# Patient Record
Sex: Male | Born: 1985 | Race: White | Hispanic: No | Marital: Single | Smoking: Current some day smoker
Health system: Southern US, Community
[De-identification: ages and names within clinical notes are randomized; demographics above are authoritative.]

---

## 2011-02-12 ENCOUNTER — Emergency Department (HOSPITAL_BASED_OUTPATIENT_CLINIC_OR_DEPARTMENT_OTHER)
Admission: EM | Admit: 2011-02-12 | Discharge: 2011-02-12 | Disposition: A | Discharge status: Home / Self Care | Payer: Self-pay | Attending: Emergency Medicine | Admitting: Emergency Medicine

## 2011-02-12 ENCOUNTER — Encounter: Payer: Self-pay | Admitting: Emergency Medicine

## 2011-02-12 DIAGNOSIS — S01119A Laceration without foreign body of unspecified eyelid and periocular area, initial encounter: Secondary | ICD-10-CM | POA: Insufficient documentation

## 2011-02-12 DIAGNOSIS — S01111A Laceration without foreign body of right eyelid and periocular area, initial encounter: Secondary | ICD-10-CM

## 2011-02-12 DIAGNOSIS — Y9367 Activity, basketball: Secondary | ICD-10-CM | POA: Insufficient documentation

## 2011-02-12 DIAGNOSIS — W219XXA Striking against or struck by unspecified sports equipment, initial encounter: Secondary | ICD-10-CM | POA: Insufficient documentation

## 2011-02-12 MED ORDER — TETANUS-DIPHTHERIA TOXOIDS TD 5-2 LFU IM INJ
INJECTION | INTRAMUSCULAR | Status: AC
  Filled 2011-02-12: qty 0.5

## 2011-02-12 MED ORDER — TETANUS-DIPHTH-ACELL PERTUSSIS 5-2-15.5 LF-MCG/0.5 IM SUSP
0.5000 mL | Freq: Once | INTRAMUSCULAR | Status: AC
  Administered 2011-02-12: 0.5 mL via INTRAMUSCULAR
  Filled 2011-02-12: qty 0.5

## 2011-02-12 NOTE — ED Provider Notes (Signed)
History     Chief Complaint  Patient presents with  . Facial Laceration   HPI Comments: Pt was hit in the right eyelid while playing basketball.  Patient is a 25 y.o. male presenting with facial injury. The history is provided by the patient and the spouse.  Facial Injury  The injury mechanism was a direct blow. No protective equipment was used. There is an injury to the face. The pain is mild. It is unlikely that a foreign body is present. There is no possibility that he inhaled smoke. Associated symptoms include weakness. Pertinent negatives include no nausea, no vomiting, no neck pain, no decreased responsiveness, no loss of consciousness and no memory loss. There have been no prior injuries to these areas. His tetanus status is out of date. He has been behaving normally. He has received no recent medical care.    History reviewed. No pertinent past medical history.  History reviewed. No pertinent past surgical history.  No family history on file.  History  Substance Use Topics  . Smoking status: Current Everyday Smoker  . Smokeless tobacco: Not on file  . Alcohol Use: No      Review of Systems  Constitutional: Negative for decreased responsiveness.  HENT: Negative for neck pain.   Gastrointestinal: Negative for nausea and vomiting.  Skin: Positive for wound.  Neurological: Positive for weakness. Negative for loss of consciousness.  Psychiatric/Behavioral: Negative for memory loss.  All other systems reviewed and are negative.    Physical Exam  BP 118/78  Pulse 69  Temp(Src) 98 F (36.7 C) (Oral)  Resp 16  SpO2 100%  Physical Exam  Constitutional: He appears well-developed and well-nourished.  HENT:  Head: Normocephalic. Head is with laceration.    Eyes: Conjunctivae and EOM are normal. Pupils are equal, round, and reactive to light.  Neck: Normal range of motion.    ED Course  LACERATION REPAIR Date/Time: 02/12/2011 8:39 PM Performed by: Langston Masker Authorized by: Olivia Mackie Consent: Verbal consent obtained. Risks and benefits: risks, benefits and alternatives were discussed Consent given by: patient Patient understanding: patient states understanding of the procedure being performed Patient consent: the patient's understanding of the procedure matches consent given Body area: head/neck Location details: right eyelid Laceration length: 1.5 cm Irrigation solution: saline Skin closure: glue Patient tolerance: Patient tolerated the procedure well with no immediate complications.    MDM Pt counseled on dermabond      Langston Masker, [place] 02/12/11 2041  Medical screening examination/treatment/procedure(s) were performed by non-physician practitioner and as supervising physician I was immediately available for consultation/collaboration.  Alphonzo Cruise, MD 02/16/11 [phone]

## 2018-03-17 ENCOUNTER — Emergency Department (HOSPITAL_COMMUNITY)
Admission: EM | Admit: 2018-03-17 | Discharge: 2018-03-18 | Disposition: A | Discharge status: Home / Self Care | Payer: No Typology Code available for payment source | Attending: Emergency Medicine | Admitting: Emergency Medicine

## 2018-03-17 ENCOUNTER — Encounter (HOSPITAL_COMMUNITY): Payer: Self-pay

## 2018-03-17 ENCOUNTER — Emergency Department (HOSPITAL_COMMUNITY): Payer: No Typology Code available for payment source

## 2018-03-17 ENCOUNTER — Other Ambulatory Visit: Payer: Self-pay

## 2018-03-17 DIAGNOSIS — Y999 Unspecified external cause status: Secondary | ICD-10-CM | POA: Diagnosis not present

## 2018-03-17 DIAGNOSIS — R51 Headache: Secondary | ICD-10-CM | POA: Diagnosis not present

## 2018-03-17 DIAGNOSIS — F17200 Nicotine dependence, unspecified, uncomplicated: Secondary | ICD-10-CM | POA: Insufficient documentation

## 2018-03-17 DIAGNOSIS — Y92410 Unspecified street and highway as the place of occurrence of the external cause: Secondary | ICD-10-CM | POA: Diagnosis not present

## 2018-03-17 DIAGNOSIS — Y9389 Activity, other specified: Secondary | ICD-10-CM | POA: Insufficient documentation

## 2018-03-17 DIAGNOSIS — S199XXA Unspecified injury of neck, initial encounter: Secondary | ICD-10-CM | POA: Diagnosis present

## 2018-03-17 DIAGNOSIS — S1093XA Contusion of unspecified part of neck, initial encounter: Secondary | ICD-10-CM | POA: Diagnosis not present

## 2018-03-17 LAB — BASIC METABOLIC PANEL
ANION GAP: 6 (ref 5–15)
BUN: 14 mg/dL (ref 6–20)
CHLORIDE: 104 mmol/L (ref 98–111)
CO2: 26 mmol/L (ref 22–32)
CREATININE: 1.22 mg/dL (ref 0.61–1.24)
Calcium: 8.5 mg/dL — ABNORMAL LOW (ref 8.9–10.3)
GFR calc non Af Amer: >60 mL/min (ref >60)
Glucose, Bld: 91 mg/dL (ref 70–99)
Potassium: 3.7 mmol/L (ref 3.5–5.1)
Sodium: 136 mmol/L (ref 135–145)

## 2018-03-17 MED ORDER — IOPAMIDOL (ISOVUE-370) INJECTION 76%
50.0000 mL | Freq: Once | INTRAVENOUS | Status: AC | PRN
  Administered 2018-03-17: 50 mL via INTRAVENOUS

## 2018-03-17 MED ORDER — IOHEXOL 300 MG/ML  SOLN: 100.0000 mL | Freq: Once | INTRAMUSCULAR | Status: DC | PRN

## 2018-03-17 MED ORDER — ACETAMINOPHEN 325 MG PO TABS
650.0000 mg | ORAL_TABLET | Freq: Once | ORAL | Status: AC
  Administered 2018-03-17: 650 mg via ORAL
  Filled 2018-03-17: qty 2
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
## 2018-03-17 NOTE — ED Triage Notes (Signed)
C-collar applied in triage.
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
## 2019-02-27 ENCOUNTER — Other Ambulatory Visit: Payer: Self-pay

## 2019-02-27 DIAGNOSIS — Z20822 Contact with and (suspected) exposure to covid-19: Secondary | ICD-10-CM

## 2019-03-03 LAB — NOVEL CORONAVIRUS, NAA: SARS-CoV-2, NAA: NOT DETECTED

## 2020-01-23 ENCOUNTER — Emergency Department (HOSPITAL_COMMUNITY)
Admission: EM | Admit: 2020-01-23 | Discharge: 2020-01-23 | Disposition: A | Discharge status: Home / Self Care | Payer: Medicaid Other | Attending: Emergency Medicine | Admitting: Emergency Medicine

## 2020-01-23 ENCOUNTER — Emergency Department (HOSPITAL_COMMUNITY): Payer: Medicaid Other

## 2020-01-23 ENCOUNTER — Other Ambulatory Visit: Payer: Self-pay

## 2020-01-23 DIAGNOSIS — F17200 Nicotine dependence, unspecified, uncomplicated: Secondary | ICD-10-CM | POA: Diagnosis not present

## 2020-01-23 DIAGNOSIS — R7989 Other specified abnormal findings of blood chemistry: Secondary | ICD-10-CM

## 2020-01-23 DIAGNOSIS — R748 Abnormal levels of other serum enzymes: Secondary | ICD-10-CM | POA: Diagnosis not present

## 2020-01-23 DIAGNOSIS — R1032 Left lower quadrant pain: Secondary | ICD-10-CM | POA: Diagnosis present

## 2020-01-23 DIAGNOSIS — R109 Unspecified abdominal pain: Secondary | ICD-10-CM

## 2020-01-23 LAB — COMPREHENSIVE METABOLIC PANEL
ALT: 63 U/L — ABNORMAL HIGH (ref 0–44)
AST: 48 U/L — ABNORMAL HIGH (ref 15–41)
Albumin: 3.6 g/dL (ref 3.5–5.0)
Alkaline Phosphatase: 131 U/L — ABNORMAL HIGH (ref 38–126)
Anion gap: 8 (ref 5–15)
BUN: 11 mg/dL (ref 6–20)
CO2: 25 mmol/L (ref 22–32)
Calcium: 8.9 mg/dL (ref 8.9–10.3)
Chloride: 104 mmol/L (ref 98–111)
Creatinine, Ser: 1 mg/dL (ref 0.61–1.24)
GFR calc Af Amer: >60 mL/min (ref >60)
GFR calc non Af Amer: >60 mL/min (ref >60)
Glucose, Bld: 108 mg/dL — ABNORMAL HIGH (ref 70–99)
Potassium: 4.1 mmol/L (ref 3.5–5.1)
Sodium: 137 mmol/L (ref 135–145)
Total Bilirubin: 1 mg/dL (ref 0.3–1.2)
Total Protein: 6.3 g/dL — ABNORMAL LOW (ref 6.5–8.1)

## 2020-01-23 LAB — URINALYSIS, ROUTINE W REFLEX MICROSCOPIC
Bacteria, UA: NONE SEEN
Bilirubin Urine: NEGATIVE
Glucose, UA: NEGATIVE mg/dL
Hgb urine dipstick: NEGATIVE
Ketones, ur: NEGATIVE mg/dL
Leukocytes,Ua: NEGATIVE
Nitrite: NEGATIVE
Protein, ur: NEGATIVE mg/dL
Specific Gravity, Urine: 1.026 (ref 1.005–1.030)
pH: 6 (ref 5.0–8.0)

## 2020-01-23 LAB — CBC
HCT: 44.1 % (ref 39.0–52.0)
Hemoglobin: 14.6 g/dL (ref 13.0–17.0)
MCH: 28.9 pg (ref 26.0–34.0)
MCHC: 33.1 g/dL (ref 30.0–36.0)
MCV: 87.2 fL (ref 80.0–100.0)
Platelets: 221 10*3/uL (ref 150–400)
RBC: 5.06 MIL/uL (ref 4.22–5.81)
RDW: 13.2 % (ref 11.5–15.5)
WBC: 4.4 10*3/uL (ref 4.0–10.5)
nRBC: 0 % (ref 0.0–0.2)

## 2020-01-23 LAB — LIPASE, BLOOD: Lipase: 35 U/L (ref 11–51)

## 2020-01-23 NOTE — ED Triage Notes (Signed)
Pt here for eval of L flank pain onset last night while playing baseball. Sts he hit the ball and then pain developed while he was running. Pain radiates to abdomen with any movement or cough. Pain worse with palpation and movement. Denies dysuria.

## 2020-01-23 NOTE — Discharge Instructions (Signed)
Please call [facility] gastroenterology tomorrow morning.  You need to schedule an appointment to have labs obtained regarding your liver function.  Please continue to take Advil for management of your left-sided abdominal pain.  You can apply ice and heat to the region.  I would recommend stretching as your pain tolerates.  Please continue to be active as your pain tolerates.  You can always return to the emergency department with any new or worsening symptoms.  It was a pleasure to meet you.

## 2020-01-23 NOTE — ED Provider Notes (Signed)
[facility] EMERGENCY DEPARTMENT Provider Note   CSN: [id number] Arrival date & time: 01/23/20  1232     History Chief Complaint  Patient presents with  . Flank Pain    Randall Potter is a 34 y.o. male.  HPI Patient is a 34 year old male who states that last night he was playing baseball and when running began noticing pain in the left lateral abdomen.  His pain persisted last night and throughout the day today.  His pain seems to worsen when coughing or sneezing as well as with palpation.  He denies a history of similar symptoms or kidney stones.  He took Advil with short-term mild relief.  No fevers, chills, chest pain, shortness of breath, nausea, vomiting, dysuria, difficulty urinating.    No past medical history on file.  There are no problems to display for this patient.   No past surgical history on file.     No family history on file.  Social History   Tobacco Use  . Smoking status: Current Every Day Smoker  . Smokeless tobacco: Never Used  Substance Use Topics  . Alcohol use: No  . Drug use: No    Home Medications Prior to Admission medications   Not on File    Allergies    Patient has no known allergies.  Review of Systems   Review of Systems  All other systems reviewed and are negative. Ten systems reviewed and are negative for acute change, except as noted in the HPI.   Physical Exam Updated Vital Signs BP (!) 135/91   Pulse 67   Temp 98.2 F (36.8 C) (Oral)   Resp 14   Ht 5\' 11"  (1.803 m)   Wt 86.2 kg   SpO2 97%   BMI 26.50 kg/m   Physical Exam Vitals and nursing note reviewed.  Constitutional:      General: He is not in acute distress.    Appearance: Normal appearance. He is normal weight. He is not ill-appearing, toxic-appearing or diaphoretic.  HENT:     Head: Normocephalic and atraumatic.     Right Ear: External ear normal.     Left Ear: External ear normal.     Nose: Nose normal.     Mouth/Throat:      Mouth: Mucous membranes are moist.     Pharynx: Oropharynx is clear. No oropharyngeal exudate or posterior oropharyngeal erythema.  Eyes:     Extraocular Movements: Extraocular movements intact.  Cardiovascular:     Rate and Rhythm: Normal rate and regular rhythm.     Pulses: Normal pulses.     Heart sounds: Normal heart sounds. No murmur heard.  No friction rub. No gallop.   Pulmonary:     Effort: Pulmonary effort is normal. No respiratory distress.     Breath sounds: Normal breath sounds. No stridor. No wheezing, rhonchi or rales.  Abdominal:     General: Abdomen is flat.     Palpations: Abdomen is soft.     Tenderness: There is abdominal tenderness.     Comments: Mild TTP noted with deep palpation along the left lateral central abdominal wall.  Abdomen is soft.  No rebound.  No CVA tenderness appreciated bilaterally with palpation or fist strike.  Musculoskeletal:        General: Normal range of motion.     Cervical back: Normal range of motion and neck supple. No tenderness.  Skin:    General: Skin is warm and dry.  Neurological:  General: No focal deficit present.     Mental Status: He is alert and oriented to person, place, and time.  Psychiatric:        Mood and Affect: Mood normal.        Behavior: Behavior normal.    ED Results / Procedures / Treatments   Labs (all labs ordered are listed, but only abnormal results are displayed) Labs Reviewed  COMPREHENSIVE METABOLIC PANEL - Abnormal; Notable for the following components:      Result Value   Glucose, Bld 108 (*)    Total Protein 6.3 (*)    AST 48 (*)    ALT 63 (*)    Alkaline Phosphatase 131 (*)    All other components within normal limits  LIPASE, BLOOD  CBC  URINALYSIS, ROUTINE W REFLEX MICROSCOPIC   EKG None  Radiology CT Renal Stone Study  Result Date: 01/23/2020 CLINICAL DATA:  Flank pain, stone disease suspected, began while playing baseball developed while running EXAM: CT ABDOMEN AND PELVIS  WITHOUT CONTRAST TECHNIQUE: Multidetector CT imaging of the abdomen and pelvis was performed following the standard protocol without IV contrast. COMPARISON:  None. FINDINGS: Lower chest: Minimal atelectatic changes in the otherwise clear lung bases. Normal heart size. No pericardial effusion. Hepatobiliary: Poorly visualized 3.6 cm hypoattenuating focus in the posterior right lobe liver (3/19). Incompletely characterized on this noncontrast CT examination. No other discernible liver lesions. Normal hepatic attenuation. Smooth liver surface contour. Normal gallbladder and biliary tree without visible calcified gallstone. Pancreas: Unremarkable. No pancreatic ductal dilatation or surrounding inflammatory changes. Spleen: Normal in size without focal abnormality. Small accessory splenule seen anteriorly. Adrenals/Urinary Tract: No concerning adrenal lesions. Symmetric size and normal positioning of the kidneys. No visible or contour deforming renal lesions. No urolithiasis or hydronephrosis. Urinary bladder is unremarkable. No bladder debris or calculi. Stomach/Bowel: Distal esophagus, stomach and duodenal sweep are unremarkable. No small bowel wall thickening or dilatation. No evidence of obstruction. A normal appendix is visualized. No colonic dilatation or wall thickening. Scattered colonic diverticula without focal pericolonic inflammation to suggest diverticulitis. Vascular/Lymphatic: No significant vascular findings are present. No enlarged abdominal or pelvic lymph nodes. Reproductive: The prostate and seminal vesicles are unremarkable. Other: No abdominopelvic free fluid or free gas. No bowel containing hernias. No body wall or retroperitoneal hematoma. Musculoskeletal: No acute osseous abnormality or suspicious osseous lesion. IMPRESSION: 1. No acute intra-abdominal process. No urolithiasis or hydronephrosis. 2. Poorly visualized 3.6 cm hypoattenuating focus in the posterior right lobe liver. Likely to  reflect a benign lesion such as a cyst or hemangioma particularly in the absence of known malignancy or hepatic risk factors. However this is incompletely characterized on this noncontrast CT examination. Recommend further evaluation with nonemergent, outpatient liver protocol MRI. Electronically Signed   By: Kreg Shropshire M.D.   On: 01/23/2020 22:22   Procedures Procedures   Medications Ordered in ED Medications - No data to display  ED Course  I have reviewed the triage vital signs and the nursing notes.  Pertinent labs & imaging results that were available during my care of the patient were reviewed by me and considered in my medical decision making (see chart for details).    MDM Rules/Calculators/A&P                          Patient is a 34 year old male that presents due to left-sided abdominal pain.  Physical exam revealed moderate TTP noted in the left lateral abdomen.  Differential at the time was kidney stone versus musculoskeletal.  CT of the abdomen was obtained showing no renal stone.  Initial labs were also obtained with a benign UA.  No RBCs or hemoglobin noted in the urine.  Benign CBC.  Negative lipase.  Incidentally, we found that his liver enzymes were elevated with an AST of 48 and an ALT of 63.  His alkaline phosphatase was elevated at 131.  CT scan showed a poorly visualized 3.6 cm hypoattenuating focus in the posterior right lobe of the liver.  This likely reflects a benign lesion such as a cyst or hemangioma particularly in the absence of known malignancy or hepatic risk factors.  Patient denies drinking any type of alcohol.  He has no right upper quadrant pain on exam.  No palpable hepatomegaly.  I discussed these findings with the patient.  For his musculoskeletal pain I recommended ice/heat as well as taking Advil as needed.  Stretching exercises as tolerated.  In regards to his elevated liver enzymes, I am going to give the patient follow-up with GI.  He is planning  on calling them tomorrow to set up a follow-up appointment for repeat hepatic panel as well as possible imaging, per GI recommendations.  His questions were answered and he was amicable at the time of discharge.  His vital signs are stable.  Patient discharged to home/self care.  Condition at discharge: Stable  Note: Portions of this report may have been transcribed using voice recognition software. Every effort was made to ensure accuracy; however, inadvertent computerized transcription errors may be present.    Final Clinical Impression(s) / ED Diagnoses Final diagnoses:  Abdominal pain, unspecified abdominal location  Elevated LFTs  Elevated alkaline phosphatase level   Rx / DC Orders ED Discharge Orders    None       Rayna Sexton, PA-C 01/23/20 2253    Tegeler, Gwenyth Allegra, MD 01/24/20 0020

## 2020-01-23 NOTE — ED Notes (Signed)
Patient verbalizes understanding of discharge instructions. Opportunity for questioning and answers were provided. Armband removed by staff, pt discharged from ED ambulatory.   

## 2020-01-31 ENCOUNTER — Other Ambulatory Visit: Payer: Self-pay | Admitting: Gastroenterology

## 2020-01-31 DIAGNOSIS — K769 Liver disease, unspecified: Secondary | ICD-10-CM

## 2020-03-06 ENCOUNTER — Ambulatory Visit
Admission: RE | Admit: 2020-03-06 | Discharge: 2020-03-06 | Disposition: A | Discharge status: Home / Self Care | Payer: Medicaid Other | Source: Ambulatory Visit | Attending: Gastroenterology | Admitting: Gastroenterology

## 2020-03-06 ENCOUNTER — Other Ambulatory Visit: Payer: Self-pay

## 2020-03-06 DIAGNOSIS — K769 Liver disease, unspecified: Secondary | ICD-10-CM

## 2020-03-06 MED ORDER — GADOBENATE DIMEGLUMINE 529 MG/ML IV SOLN
20.0000 mL | Freq: Once | INTRAVENOUS | Status: AC | PRN
  Administered 2020-03-06: 20 mL via INTRAVENOUS

## 2021-01-16 IMAGING — MR MR ABDOMEN WO/W CM
18 series · 48 of 48 positions shown · IV contrast (18ml Multihance)
Comparison: Noncontrast CT on 01/23/2020

CLINICAL DATA: Indeterminate liver lesion seen on recent
noncontrast CT.

EXAM:
MRI ABDOMEN WITHOUT AND WITH CONTRAST
TECHNIQUE: Multiplanar multisequence MR imaging of the abdomen was performed
both before and after the administration of intravenous contrast.
CONTRAST:  20mL MULTIHANCE GADOBENATE DIMEGLUMINE 529 MG/ML IV SOLN

[Series 2: T2 · coronal · 5.0mm · 1.56mm/px · 1 of 41 slices shown (1 of 3)]
[im 1/41]
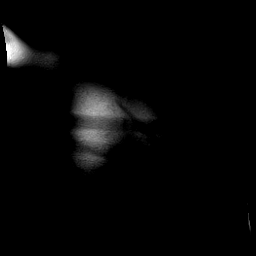

[Series 3: T1 · axial · 3.0mm · 1.19mm/px · z∈[-119,+94]mm · 4 of 144 slices shown (1 of 2)]
[im 1/144]
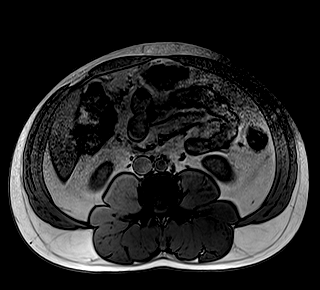
[im 48/144]
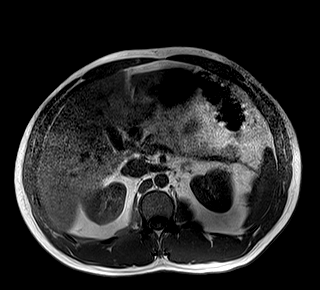
[im 96/144]
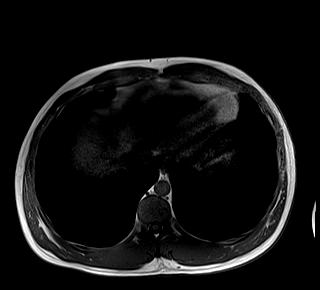
[im 144/144]
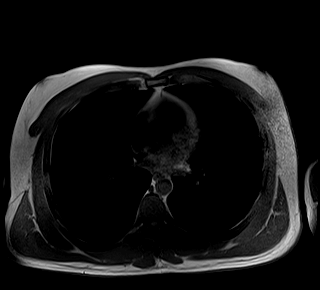

[Series 4: T2 · axial · 5.0mm · 1.48mm/px · 1 of 38 slices shown (2 of 3)]
[im 1/38]
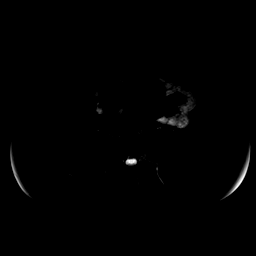

[Series 5: DWI · axial · 5.0mm · 1.42mm/px · z∈[-147,+63]mm · 4 of 108 slices shown (1 of 2)]
[im 1/108]
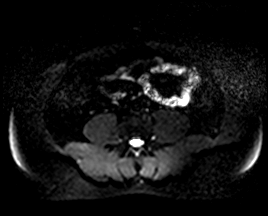
[im 36/108]
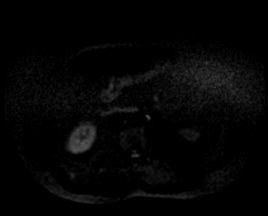
[im 72/108]
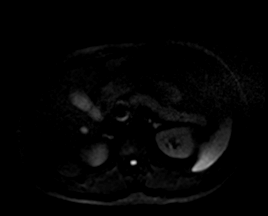
[im 108/108]
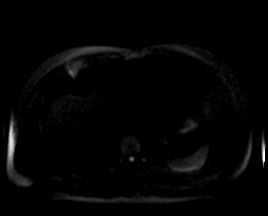

[Series 6: DWI · axial · 5.0mm · 1.42mm/px · 1 of 36 slices shown (2 of 2)]
[im 1/36]
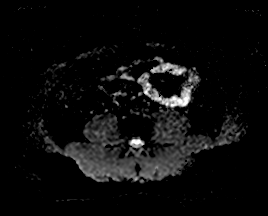

[Series 7: T2 · axial · 6.0mm · 1.19mm/px · 1 of 33 slices shown (3 of 3)]
[im 1/33]
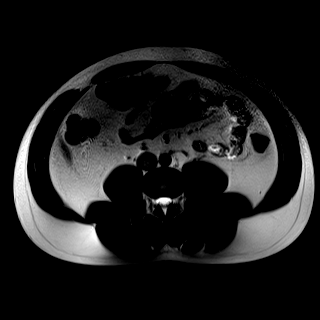

[Series 8: bSSFP · axial · 5.0mm · 1.32mm/px · 1 of 38 slices shown]
[im 1/38]
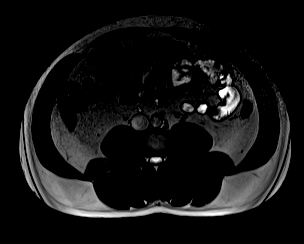

[Series 9: T1 · axial · 3.0mm · 1.19mm/px · z∈[-148,+65]mm · 5 of 144 slices shown (2 of 2)]
[im 1/144]
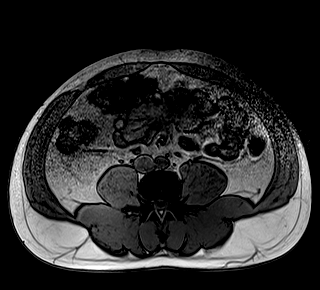
[im 36/144]
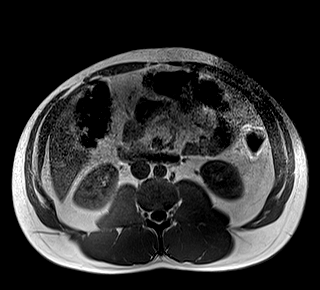
[im 72/144]
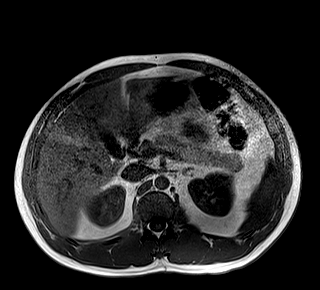
[im 108/144]
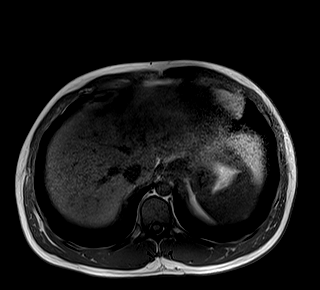
[im 144/144]
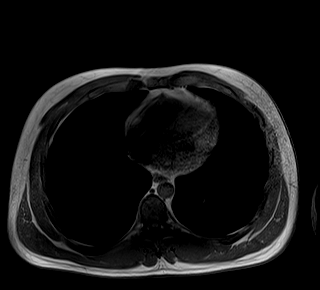

[Series 10: T1 dynamic · axial · non-contrast · 3.0mm · 1.25mm/px · z∈[-159,+102]mm · 3 of 88 slices shown]
[im 1/88]
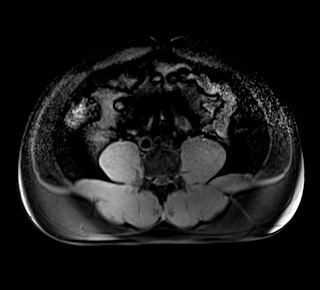
[im 44/88]
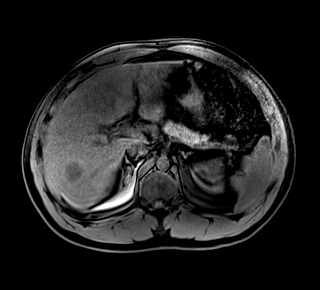
[im 88/88]
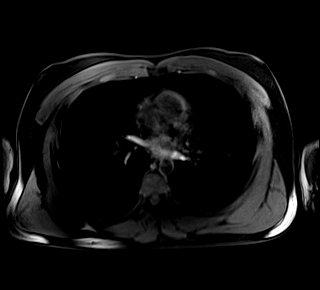

[Series 11: T1 dynamic post-contrast · axial · 3.0mm · 1.25mm/px · z∈[-159,+102]mm · 3 of 88 slices shown (1 of 9)]
[im 1/88]
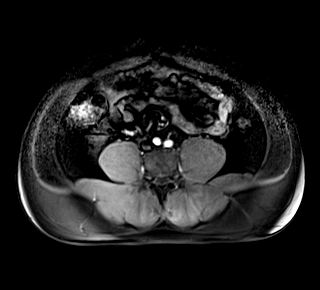
[im 44/88]
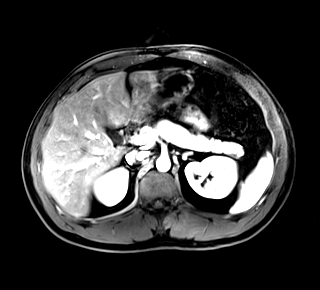
[im 88/88]
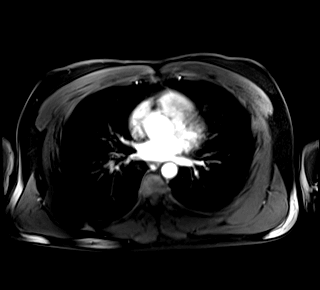

[Series 12: T1 dynamic post-contrast · axial · 3.0mm · 1.25mm/px · z∈[-159,+102]mm · 3 of 88 slices shown (2 of 9)]
[im 1/88]
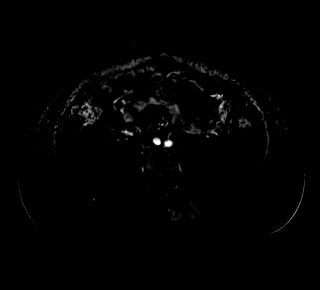
[im 44/88]
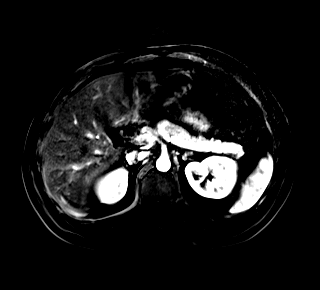
[im 88/88]
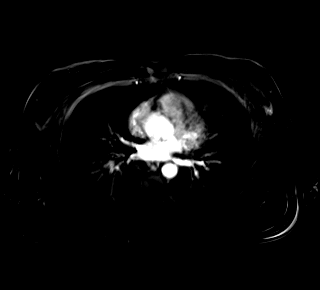

[Series 13: T1 dynamic post-contrast · axial · 3.0mm · 1.25mm/px · z∈[-159,+102]mm · 3 of 88 slices shown (3 of 9)]
[im 1/88]
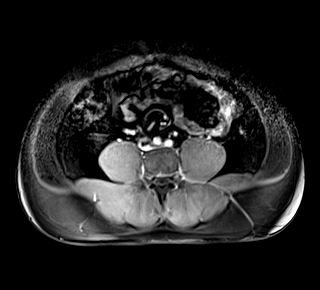
[im 44/88]
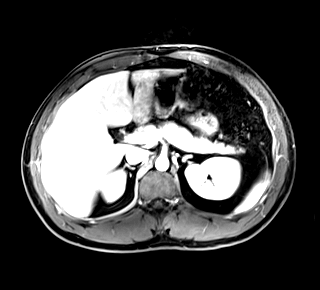
[im 88/88]
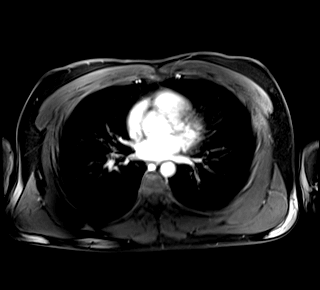

[Series 14: T1 dynamic post-contrast · axial · 3.0mm · 1.25mm/px · z∈[-159,+102]mm · 3 of 88 slices shown (4 of 9)]
[im 1/88]
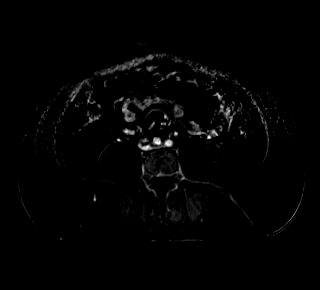
[im 44/88]
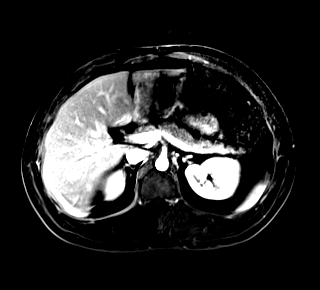
[im 88/88]
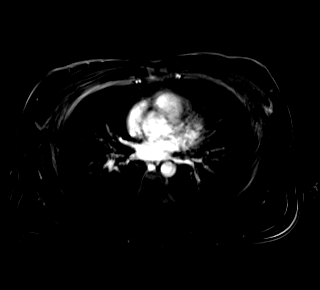

[Series 15: T1 dynamic post-contrast · axial · 3.0mm · 1.25mm/px · z∈[-159,+102]mm · 3 of 88 slices shown (5 of 9)]
[im 1/88]
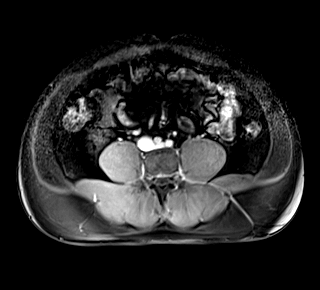
[im 44/88]
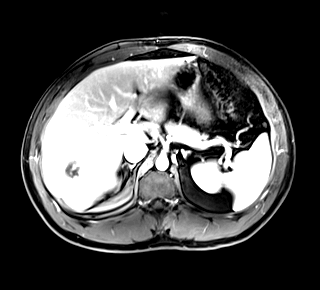
[im 88/88]
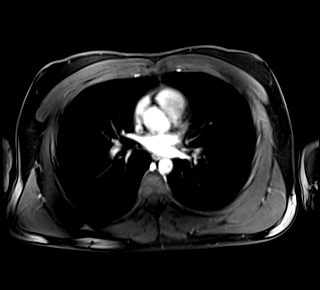

[Series 16: T1 dynamic post-contrast · axial · 3.0mm · 1.25mm/px · z∈[-159,+102]mm · 3 of 88 slices shown (6 of 9)]
[im 1/88]
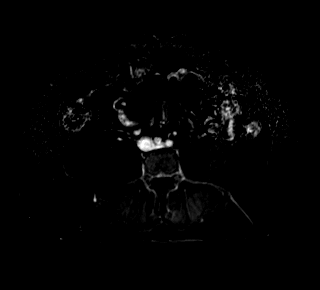
[im 44/88]
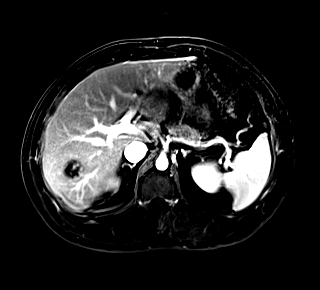
[im 88/88]
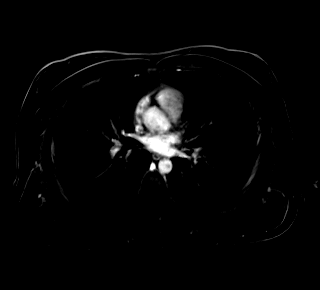

[Series 17: T1 dynamic post-contrast · coronal · 3.0mm · 1.25mm/px · 3 of 88 slices shown (7 of 9)]
[im 1/88]
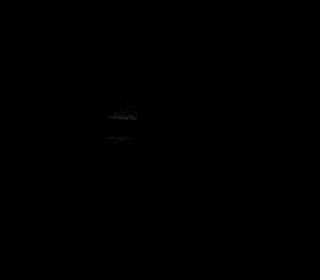
[im 44/88]
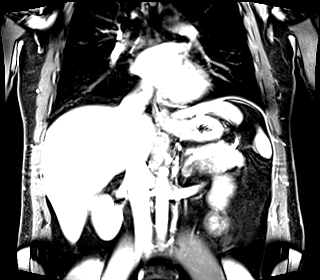
[im 88/88]
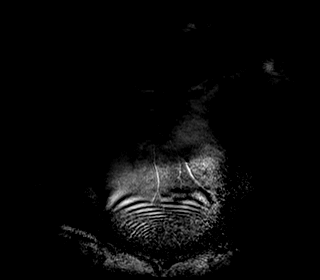

[Series 18: T1 dynamic post-contrast · axial · 3.0mm · 1.25mm/px · z∈[-159,+102]mm · 3 of 88 slices shown (8 of 9)]
[im 1/88]
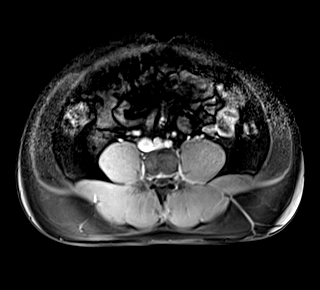
[im 44/88]
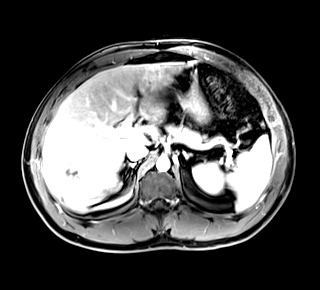
[im 88/88]
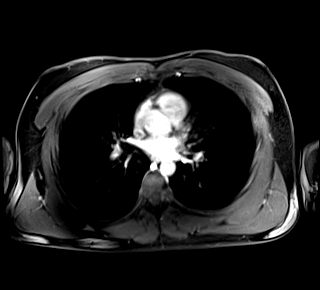

[Series 19: T1 dynamic post-contrast · axial · 3.0mm · 1.25mm/px · z∈[-159,+102]mm · 3 of 88 slices shown (9 of 9)]
[im 1/88]
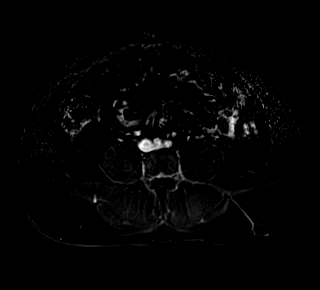
[im 44/88]
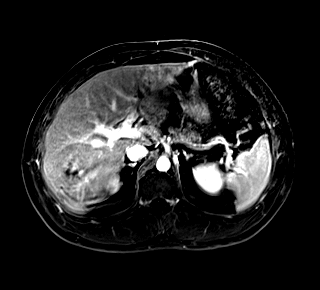
[im 88/88]
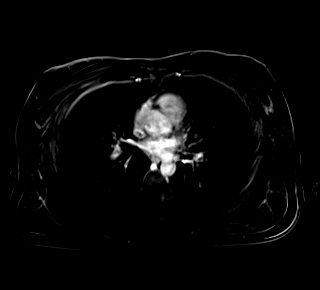

[48 of 48 positions shown; findings below may reference images not displayed]

FINDINGS: Lower chest: No acute findings.

Hepatobiliary: A benign hemangioma is seen in the posterior right
hepatic lobe which measures 3.2 x 3.1 cm. This corresponds with the
lesions seen on recent CT. A 2nd 1.3 cm hemangioma is also seen in
the posterior right lobe. Mild diffuse hepatic steatosis is also
noted on chemical shift imaging. Gallbladder is unremarkable. No
evidence of biliary ductal dilatation.

Pancreas: No mass or inflammatory changes. Pancreas divisum is
noted, however there is no evidence of pancreatic ductal dilatation.

Spleen:  Within normal limits in size and appearance.

Adrenals/Urinary Tract: No masses identified. No evidence of
hydronephrosis.

Stomach/Bowel: Visualized portion unremarkable.

Vascular/Lymphatic: No pathologically enlarged lymph nodes
identified. No abdominal aortic aneurysm.

Other:  None.

Musculoskeletal:  No suspicious bone lesions identified.
IMPRESSION: Benign hepatic hemangiomas, largest measuring 3 cm which corresponds
with lesion seen on recent ultrasound.

Mild hepatic steatosis.

## 2022-02-20 ENCOUNTER — Encounter (HOSPITAL_COMMUNITY): Payer: Self-pay

## 2022-02-20 ENCOUNTER — Ambulatory Visit (INDEPENDENT_AMBULATORY_CARE_PROVIDER_SITE_OTHER): Payer: BLUE CROSS/BLUE SHIELD

## 2022-02-20 ENCOUNTER — Ambulatory Visit (HOSPITAL_COMMUNITY)
Admission: EM | Admit: 2022-02-20 | Discharge: 2022-02-20 | Disposition: A | Discharge status: Home / Self Care | Payer: BLUE CROSS/BLUE SHIELD | Attending: Internal Medicine | Admitting: Internal Medicine

## 2022-02-20 DIAGNOSIS — M79675 Pain in left toe(s): Secondary | ICD-10-CM

## 2022-02-20 DIAGNOSIS — S99922A Unspecified injury of left foot, initial encounter: Secondary | ICD-10-CM | POA: Diagnosis not present

## 2022-02-20 MED ORDER — TETANUS-DIPHTH-ACELL PERTUSSIS 5-2.5-18.5 LF-MCG/0.5 IM SUSY
0.5000 mL | PREFILLED_SYRINGE | Freq: Once | INTRAMUSCULAR | Status: AC
  Administered 2022-02-20: 0.5 mL via INTRAMUSCULAR

## 2022-02-20 MED ORDER — TETANUS-DIPHTH-ACELL PERTUSSIS 5-2.5-18.5 LF-MCG/0.5 IM SUSY
PREFILLED_SYRINGE | INTRAMUSCULAR | Status: AC
  Filled 2022-02-20: qty 0.5

## 2022-02-20 NOTE — ED Notes (Signed)
Nonadhearant dressing and coban applied to toe

## 2022-02-20 NOTE — Discharge Instructions (Signed)
Your x-ray was normal.  A dressing has been applied.  Please keep wound clean and covered until healed over.  Monitor for signs of infection that include increased redness, swelling, pus.  Follow-up with podiatry if toenail is not healing well.

## 2022-02-20 NOTE — ED Triage Notes (Signed)
[facility] to [facility] with pain and bleeding at L big toe. Pt reports he opened the door and hit it on his toe. Pt reports 7/10 pain no otc medication. Antibacterial cream and cleansed it

## 2022-02-20 NOTE — ED Provider Notes (Signed)
[facility]    CSN: [id number] Arrival date & time: 02/20/22  1556      History   Chief Complaint Chief Complaint  Patient presents with   L toe pain    HPI Randall Potter is a 36 y.o. male.   Patient presents with left great toe pain after an injury that occurred today.  Patient reports that he opened a very large door on his toe.  He has been having some bleeding and left great toe pain.  He has used antibiotic cream and cleansed it but has not taken medications for it.  Denies numbness or tingling.  Patient is able to bear weight. Last tetanus vaccine was over 10 years ago per patient. Does not take blood thinners.      No past medical history on file.  There are no problems to display for this patient.   No past surgical history on file.     Home Medications    Prior to Admission medications   Medication Sig Start Date End Date Taking? Authorizing Provider  acetaminophen (TYLENOL) 500 MG tablet Take 1,000 mg by mouth every 6 (six) hours as needed for mild pain.    [provider]  cyclobenzaprine (FLEXERIL) 10 MG tablet Take 10 mg by mouth 3 (three) times daily as needed for muscle spasms.    [provider]    Family History No family history on file.  Social History Social History   Tobacco Use   Smoking status: Some Days    Types: Cigarettes   Smokeless tobacco: Never  Substance Use Topics   Alcohol use: No   Drug use: No     Allergies   Patient has no known allergies.   Review of Systems Review of Systems Per HPI  Physical Exam Triage Vital Signs ED Triage Vitals  Enc Vitals Group     BP 02/20/22 1621 131/78     Pulse Rate 02/20/22 1621 71     Resp 02/20/22 1621 18     Temp 02/20/22 1621 98.2 F (36.8 C)     Temp src --      SpO2 02/20/22 1621 97 %     Weight --      Height --      Head Circumference --      Peak Flow --      Pain Score 02/20/22 1620 7     Pain Loc --      Pain Edu? --      Excl.  in GC? --    No data found.  Updated Vital Signs BP 131/78   Pulse 71   Temp 98.2 F (36.8 C)   Resp 18   SpO2 97%   Visual Acuity Right Eye Distance:   Left Eye Distance:   Bilateral Distance:    Right Eye Near:   Left Eye Near:    Bilateral Near:     Physical Exam Constitutional:      General: He is not in acute distress.    Appearance: Normal appearance. He is not toxic-appearing or diaphoretic.  HENT:     Head: Normocephalic and atraumatic.  Eyes:     Extraocular Movements: Extraocular movements intact.     Conjunctiva/sclera: Conjunctivae normal.  Pulmonary:     Effort: Pulmonary effort is normal.  Feet:     Comments: Bleeding surrounding left great toenail.  Mild crack from medial portion of nail to about halfway through.  Nail appears intact to nailbed throughout.  No subungual hematoma present.  No lacerations to skin.  Capillary refill and pulses normal.  No tenderness to palpation to remainder of left great toe or foot.  Has full ROM of toe.  Neurological:     General: No focal deficit present.     Mental Status: He is alert and oriented to person, place, and time. Mental status is at baseline.  Psychiatric:        Mood and Affect: Mood normal.        Behavior: Behavior normal.        Thought Content: Thought content normal.        Judgment: Judgment normal.      UC Treatments / Results  Labs (all labs ordered are listed, but only abnormal results are displayed) Labs Reviewed - No data to display  EKG   Radiology DG Toe Great Left  Result Date: 02/20/2022 CLINICAL DATA:  Pain and bleeding and left great toe, trauma EXAM: LEFT GREAT TOE COMPARISON:  None Available. FINDINGS: Frontal, oblique, lateral views of the left great toe are obtained. There is diffuse soft tissue swelling. No fracture or radiopaque foreign body. Joint spaces are well preserved. IMPRESSION: 1. Soft tissue swelling.  No fracture or radiopaque foreign body. Electronically Signed    By: Sharlet Salina M.D.   On: 02/20/2022 16:57    Procedures Procedures (including critical care time)  Medications Ordered in UC Medications  Tdap (BOOSTRIX) injection 0.5 mL (0.5 mLs Intramuscular Given 02/20/22 1652)    Initial Impression / Assessment and Plan / UC Course  I have reviewed the triage vital signs and the nursing notes.  Pertinent labs & imaging results that were available during my care of the patient were reviewed by me and considered in my medical decision making (see chart for details).     X-ray of toe was negative for any acute bony abnormality.  Bleeding is controlled on toe.  The nail appears intact with mild cracking.  Do not think toenail removal is necessary.  Wound was cleansed and nonadherent dressing was applied to nailbed.  Patient was advised not to attempt to remove toenail as it should heal spontaneously.  He was provided with contact information for podiatry if it does not heal appropriately or if it worsens.  Advised to monitor for signs of infection and keep covered until healed.  Tetanus vaccine was updated.  Discussed return precautions.  Patient verbalized understanding and was agreeable with plan. Final Clinical Impressions(s) / UC Diagnoses   Final diagnoses:  Injury of great toe of left foot, initial encounter     Discharge Instructions      Your x-ray was normal.  A dressing has been applied.  Please keep wound clean and covered until healed over.  Monitor for signs of infection that include increased redness, swelling, pus.  Follow-up with podiatry if toenail is not healing well.     ED Prescriptions   None    PDMP not reviewed this encounter.   Gustavus Bryant, [place] 02/20/22 1712
